# Patient Record
Sex: Male | Born: 2005 | Race: Black or African American | Hispanic: No | Marital: Single | Smoking: Never smoker
Health system: Southern US, Community
[De-identification: ages and names within clinical notes are randomized; demographics above are authoritative.]

## PROBLEM LIST (undated history)

## (undated) DIAGNOSIS — T148XXA Other injury of unspecified body region, initial encounter: Secondary | ICD-10-CM

## (undated) DIAGNOSIS — H539 Unspecified visual disturbance: Secondary | ICD-10-CM

## (undated) DIAGNOSIS — R010 Benign and innocent cardiac murmurs: Secondary | ICD-10-CM

---
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
## 2024-02-22 ENCOUNTER — Encounter: Payer: Self-pay | Admitting: Podiatry

## 2024-02-22 ENCOUNTER — Ambulatory Visit (INDEPENDENT_AMBULATORY_CARE_PROVIDER_SITE_OTHER): Admitting: Podiatry

## 2024-02-22 DIAGNOSIS — L0889 Other specified local infections of the skin and subcutaneous tissue: Secondary | ICD-10-CM

## 2024-02-22 DIAGNOSIS — L989 Disorder of the skin and subcutaneous tissue, unspecified: Secondary | ICD-10-CM

## 2024-02-22 MED ORDER — TRIAMCINOLONE ACETONIDE 0.5 % EX OINT: 1.0000 | TOPICAL_OINTMENT | Freq: Two times a day (BID) | CUTANEOUS | 1 refills | Status: DC

## 2024-02-22 NOTE — Progress Notes (Signed)
   HPI: 18 y.o. male presenting today for follow-up evaluation of a symptomatic skin lesion to the plantar aspect of the left heel.  Patient has had some mild improvement with the Lotrisone cream but he continues to have persistent hyperkeratosis of the skin.  Past Medical History:  Diagnosis Date   Fracture    wrist   Still's heart murmur    Vision abnormalities    wears glasses    Past Surgical History:  Procedure Laterality Date   ORIF WRIST FRACTURE Right 06/29/2020   Procedure: OPEN REDUCTION INTERNAL FIXATION (ORIF) WRIST FRACTURE;  Surgeon: Sheral Apley, MD;  Location: [facility];  Service: Orthopedics;  Laterality: Right;    No Known Allergies   LT foot 02/19/2022; resolved   LT foot 10/07/2023   LT foot 02/22/2024   Physical Exam: General: The patient is alert and oriented x3 in no acute distress.  Dermatology: Improved.  Hyperkeratotic skin lesion noted to the plantar aspect of the left forefoot.  No open wound noted.  Please see above noted photo  Vascular: Palpable pedal pulses bilaterally. Capillary refill within normal limits.  Negative for any significant edema or erythema  Neurological: Light touch and protective threshold grossly intact  Musculoskeletal Exam: No pedal deformities noted  Assessment: 1.  Hyperkeratotic skin lesion left plantar heel   Plan of Care:  -Patient evaluated.  - The patient has run out of the Lotrisone cream.  He was hoping to try different topical medication since he did not get the improvement that he was hoping for -Prescription for Kenalog ointment.  Apply twice daily under occlusion -Return to clinic as needed.  If the lesion persists we may need to proceed with skin biopsy.  *working two jobs at [employer] and [employer], DPM [facility]  Dr. Felecia Shelling, DPM    [address] [place], [place] [address]                Office [phone]  Fax [phone]

## 2024-05-05 ENCOUNTER — Other Ambulatory Visit: Payer: Self-pay

## 2024-05-05 ENCOUNTER — Emergency Department (HOSPITAL_COMMUNITY)

## 2024-05-05 ENCOUNTER — Encounter (HOSPITAL_COMMUNITY): Payer: Self-pay | Admitting: Emergency Medicine

## 2024-05-05 ENCOUNTER — Emergency Department (HOSPITAL_COMMUNITY)
Admission: EM | Admit: 2024-05-05 | Discharge: 2024-05-05 | Disposition: A | Discharge status: Home / Self Care | Attending: Emergency Medicine | Admitting: Emergency Medicine

## 2024-05-05 DIAGNOSIS — W230XXA Caught, crushed, jammed, or pinched between moving objects, initial encounter: Secondary | ICD-10-CM | POA: Diagnosis not present

## 2024-05-05 DIAGNOSIS — S60112A Contusion of left thumb with damage to nail, initial encounter: Secondary | ICD-10-CM | POA: Insufficient documentation

## 2024-05-05 MED ORDER — IBUPROFEN 400 MG PO TABS
400.0000 mg | ORAL_TABLET | Freq: Once | ORAL | Status: AC
  Administered 2024-05-05: 400 mg via ORAL
  Filled 2024-05-05: qty 1

## 2024-05-05 MED ORDER — HYDROCODONE-ACETAMINOPHEN 5-325 MG PO TABS
2.0000 | ORAL_TABLET | Freq: Once | ORAL | Status: AC
  Administered 2024-05-05: 2 via ORAL
  Filled 2024-05-05: qty 2

## 2024-05-05 NOTE — ED Provider Notes (Signed)
 [place] EMERGENCY DEPARTMENT AT [facility] Provider Note   CSN: [id number] Arrival date & time: 05/05/24  1654     Patient presents with: Finger Injury   Paul [name] is a 18 y.o. male.   Pt was at work today and left thumb accidentally closed in vehicle door. Pt with contusion to distal phalanx of thumb. Is right hand dominant. Skin intact. Denies other pain or injury.   The history is provided by the patient, a parent and medical records.       Prior to Admission medications   Medication Sig Start Date End Date Taking? Authorizing Provider  acetaminophen  (TYLENOL ) 160 MG/5ML liquid Take 17.2 mLs (550.4 mg total) by mouth every 6 (six) hours as needed for fever or pain. 10/22/17   Jannine Meo, NP  clotrimazole -betamethasone  (LOTRISONE ) cream Apply 1 Application topically 2 (two) times daily. 10/07/23   Dot Gazella, DPM  mupirocin ointment (BACTROBAN) 2 % SMARTSIG:sparingly Topical Twice Daily 02/04/22   [provider]  triamcinolone  ointment (KENALOG ) 0.5 % Apply 1 Application topically 2 (two) times daily. 02/22/24   Dot Gazella, DPM    Allergies: Patient has no known allergies.    Review of Systems  Musculoskeletal:        Contusion left thumb  Skin:  Negative for wound.  Neurological:  Negative for weakness and numbness.    Updated Vital Signs BP 138/87   Pulse 66   Temp 98.7 F (37.1 C)   Resp 18   SpO2 100%   Physical Exam Vitals and nursing note reviewed.  Constitutional:      Appearance: Normal appearance. He is well-developed.  HENT:     Head: Atraumatic.  Neck:     Trachea: No tracheal deviation.   Cardiovascular:     Pulses: Normal pulses.  Pulmonary:     Effort: Pulmonary effort is normal. No accessory muscle usage or respiratory distress.  Abdominal:     General: Bowel sounds are normal.   Musculoskeletal:     Comments: Mild swelling and tenderness to distal phalanx of left thumb. Normal cap refill  distally. Flexor/extensor tendon function intact. Subungual hematoma has been trephinated in triage (pt notes some improvement in discomfort). No laceration noted.    Skin:    General: Skin is warm and dry.     Findings: No rash.   Neurological:     Mental Status: He is alert.     Comments: Alert, speech clear. Left hand/thumb nvi.   Psychiatric:        Mood and Affect: Mood normal.     (all labs ordered are listed, but only abnormal results are displayed) Labs Reviewed - No data to display  EKG: None  Radiology: DG Finger Thumb Left Result Date: 05/05/2024 CLINICAL DATA:  Status post trauma. EXAM: LEFT THUMB 2+V COMPARISON:  None Available. FINDINGS: There is no evidence of fracture or dislocation. There is no evidence of arthropathy or other focal bone abnormality. Soft tissues are unremarkable. IMPRESSION: Negative. Electronically Signed   By: Virgle Grime M.D.   On: 05/05/2024 19:05     Procedures   Medications Ordered in the ED  ibuprofen  (ADVIL ) tablet 400 mg (has no administration in time range)  HYDROcodone -acetaminophen  (NORCO/VICODIN) 5-325 MG per tablet 2 tablet (2 tablets Oral Given 05/05/24 1914)  Medical Decision Making Problems Addressed: Contusion of left thumb with damage to nail, initial encounter: acute illness or injury Subungual hematoma of left thumb, initial encounter: acute illness or injury  Amount and/or Complexity of Data Reviewed Independent Historian: parent    Details: hx External Data Reviewed: notes. Radiology: ordered and independent interpretation performed. Decision-making details documented in ED Course.  Risk OTC drugs. Prescription drug management.   Xrays.   Nail trephinated by APP.   Hydrocodone  po, ibuprofen  po.   Xrays reviewed/interpreted by me - no fx.  Coldpack.   Pt appears stable for ed d/c.        Final diagnoses:  None    ED Discharge Orders     None           Guadalupe Lee, MD 05/05/24 2120

## 2024-05-05 NOTE — Discharge Instructions (Addendum)
 It was our pleasure to provide your ER care today - we hope that you feel better.  Elevate thumb to help with pain and swelling. Coldpack to sore area.   Take acetaminophen  or ibuprofen  as need for pain.  Return to ER if worse, new symptoms, intractable/severe pain, numbness/weakness, infection of thumb, or other concern.   You were given pain meds in the ER - no driving for the next 6 hours.

## 2024-05-05 NOTE — ED Triage Notes (Signed)
 Pt reports slamming his left thumb in a car door while at work today.

## 2024-05-05 NOTE — ED Provider Triage Note (Signed)
 Emergency Medicine Provider Triage Evaluation Note  Joby Richart , a 18 y.o. male  was evaluated in triage.  Pt complains of left thumb pain after having his thumb slammed in a car door.  He has pain in the entirety of the left thumb, no gross deformities.  No other associated injuries..  Review of Systems  Positive: Swelling and tenderness to the left thumb Negative: Deformity, bleeding  Physical Exam  BP (!) 142/102 (BP Location: Right Arm)   Pulse 62   Temp 99 F (37.2 C)   Resp 16   SpO2 100%  Gen:   Awake, no distress   Resp:  Normal effort  MSK:   Moves extremities without difficulty; swelling noted to the distal left thumb along with subungual hematoma. Other:    Medical Decision Making  Medically screening exam initiated at 6:55 PM.  Appropriate orders placed.  Richie Lainez was informed that the remainder of the evaluation will be completed by another provider, this initial triage assessment does not replace that evaluation, and the importance of remaining in the ED until their evaluation is complete.  To relieve initial pain and pressure associated with subungual hematoma, perform trephination of the left thumbnail.  Successfully evacuated blood from underneath of the nail which relieved some of the patient's discomfort.  For continued pain management will provide patient with additional dose of hydrocodone , pending x-ray results at this time.   Juanetta Nordmann, [place] 05/05/24 [phone]

## 2024-05-09 ENCOUNTER — Other Ambulatory Visit: Payer: Self-pay

## 2024-05-09 ENCOUNTER — Emergency Department (HOSPITAL_BASED_OUTPATIENT_CLINIC_OR_DEPARTMENT_OTHER)
Admission: EM | Admit: 2024-05-09 | Discharge: 2024-05-10 | Disposition: A | Discharge status: Home / Self Care | Attending: Emergency Medicine | Admitting: Emergency Medicine

## 2024-05-09 ENCOUNTER — Encounter (HOSPITAL_BASED_OUTPATIENT_CLINIC_OR_DEPARTMENT_OTHER): Payer: Self-pay

## 2024-05-09 DIAGNOSIS — Z23 Encounter for immunization: Secondary | ICD-10-CM | POA: Insufficient documentation

## 2024-05-09 DIAGNOSIS — W228XXD Striking against or struck by other objects, subsequent encounter: Secondary | ICD-10-CM | POA: Insufficient documentation

## 2024-05-09 DIAGNOSIS — S60112D Contusion of left thumb with damage to nail, subsequent encounter: Secondary | ICD-10-CM | POA: Insufficient documentation

## 2024-05-09 DIAGNOSIS — S6010XD Contusion of unspecified finger with damage to nail, subsequent encounter: Secondary | ICD-10-CM

## 2024-05-09 DIAGNOSIS — S6992XD Unspecified injury of left wrist, hand and finger(s), subsequent encounter: Secondary | ICD-10-CM | POA: Diagnosis present

## 2024-05-09 NOTE — ED Provider Notes (Signed)
  [place] EMERGENCY DEPARTMENT AT [facility] Provider Note   CSN: [id number] Arrival date & time: 05/09/24  2012     Patient presents with: Finger Injury   Paul [name] is a 18 y.o. male.  {Add pertinent medical, surgical, social history, OB history to YEP:67052} The history is provided by the patient.   Paul [name] is a 18 y.o. male who presents to the Emergency Department complaining of *** Thumb injury on Thursday. Had trephination performed, now there is more blood present and pain/pressure.    No fever,chills, redness.    Utd   Prior to Admission medications   Medication Sig Start Date End Date Taking? Authorizing Provider  acetaminophen  (TYLENOL ) 160 MG/5ML liquid Take 17.2 mLs (550.4 mg total) by mouth every 6 (six) hours as needed for fever or pain. 10/22/17   Everlean Laymon SAILOR, NP  clotrimazole -betamethasone  (LOTRISONE ) cream Apply 1 Application topically 2 (two) times daily. 10/07/23   Janit Thresa HERO, DPM  mupirocin ointment (BACTROBAN) 2 % SMARTSIG:sparingly Topical Twice Daily 02/04/22   [provider]  triamcinolone  ointment (KENALOG ) 0.5 % Apply 1 Application topically 2 (two) times daily. 02/22/24   Janit Thresa HERO, DPM    Allergies: Patient has no known allergies.    Review of Systems  Updated Vital Signs BP (!) 144/90 (BP Location: Right Arm)   Pulse 65   Temp 98.8 F (37.1 C)   Resp 16   Ht 5' 5 (1.651 m)   Wt 59 kg   SpO2 100%   BMI 21.63 kg/m   Physical Exam  (all labs ordered are listed, but only abnormal results are displayed) Labs Reviewed - No data to display  EKG: None  Radiology: No results found.  {Document cardiac monitor, telemetry assessment procedure when appropriate:32947} Procedures   Medications Ordered in the ED - No data to display    {Click here for ABCD2, HEART and other calculators REFRESH Note before signing:1}                              Medical Decision Making  ***  {Document  critical care time when appropriate  Document review of labs and clinical decision tools ie CHADS2VASC2, etc  Document your independent review of radiology images and any outside records  Document your discussion with family members, caretakers and with consultants  Document social determinants of health affecting pt's care  Document your decision making why or why not admission, treatments were needed:32947:::1}   Final diagnoses:  None    ED Discharge Orders     None

## 2024-05-09 NOTE — ED Triage Notes (Signed)
 Pt presents with a follow up from an injury on 6/19 where a car door was slammed into his L thumb. He was evaluated in the ED at [facility] and discharged home. Today pt is having increased pain and a blood collection under the nail.

## 2024-05-10 MED ORDER — TETANUS-DIPHTH-ACELL PERTUSSIS 5-2.5-18.5 LF-MCG/0.5 IM SUSY
0.5000 mL | PREFILLED_SYRINGE | Freq: Once | INTRAMUSCULAR | Status: AC
  Administered 2024-05-10: 0.5 mL via INTRAMUSCULAR
  Filled 2024-05-10: qty 0.5

## 2024-05-10 MED ORDER — ONDANSETRON 4 MG PO TBDP
4.0000 mg | ORAL_TABLET | Freq: Once | ORAL | Status: AC
  Administered 2024-05-10: 4 mg via ORAL
  Filled 2024-05-10: qty 1

## 2024-05-10 MED ORDER — LIDOCAINE HCL 2 % IJ SOLN
10.0000 mL | Freq: Once | INTRAMUSCULAR | Status: AC
  Administered 2024-05-10: 200 mg
  Filled 2024-05-10: qty 20

## 2024-05-10 MED ORDER — CEPHALEXIN 250 MG PO CAPS
500.0000 mg | ORAL_CAPSULE | Freq: Once | ORAL | Status: AC
  Administered 2024-05-10: 500 mg via ORAL
  Filled 2024-05-10: qty 2

## 2024-05-10 MED ORDER — CEPHALEXIN 500 MG PO CAPS: 500.0000 mg | ORAL_CAPSULE | Freq: Four times a day (QID) | ORAL | 0 refills | Status: DC

## 2024-05-10 NOTE — ED Notes (Signed)
 Pt has hematoma to entire tip of L thumb including nail bed and entire nail.

## 2024-06-06 ENCOUNTER — Ambulatory Visit (HOSPITAL_COMMUNITY)

## 2024-07-13 ENCOUNTER — Ambulatory Visit: Admitting: Podiatry

## 2024-08-01 ENCOUNTER — Ambulatory Visit (INDEPENDENT_AMBULATORY_CARE_PROVIDER_SITE_OTHER): Admitting: Podiatry

## 2024-08-01 DIAGNOSIS — D492 Neoplasm of unspecified behavior of bone, soft tissue, and skin: Secondary | ICD-10-CM | POA: Diagnosis not present

## 2024-08-01 MED ORDER — TRIAMCINOLONE ACETONIDE 0.5 % EX OINT: 1.0000 | TOPICAL_OINTMENT | Freq: Two times a day (BID) | CUTANEOUS | 3 refills | Status: AC

## 2024-08-01 NOTE — Progress Notes (Signed)
   Chief Complaint  Patient presents with   Skin Lesion    Bilateral medial heel/arch. Is not currently using topical meds due to running out.     HPI: 18 y.o. male presenting today for follow-up evaluation of a symptomatic skin lesion to the plantar aspect of the left heel.  Patient has had some mild improvement with the Kenalog  ointment.  He states that he ran out of the Kenalog  ointment and the callus skin returned  Past Medical History:  Diagnosis Date   Fracture    wrist   Still's heart murmur    Vision abnormalities    wears glasses    Past Surgical History:  Procedure Laterality Date   ORIF WRIST FRACTURE Right 06/29/2020   Procedure: OPEN REDUCTION INTERNAL FIXATION (ORIF) WRIST FRACTURE;  Surgeon: Beverley Evalene BIRCH, MD;  Location: [facility];  Service: Orthopedics;  Laterality: Right;    No Known Allergies   LT foot 02/19/2022; resolved   LT foot 10/07/2023   LT foot 02/22/2024   B/L feet 08/01/2024   Physical Exam: General: The patient is alert and oriented x3 in no acute distress.  Dermatology: Hyperkeratotic skin lesion noted to the plantar aspect of the bilateral heels symmetrical in appearance.  No open wound noted.  Please see above noted photo  Vascular: Palpable pedal pulses bilaterally. Capillary refill within normal limits.  Negative for any significant edema or erythema  Neurological: Light touch and protective threshold grossly intact  Musculoskeletal Exam: No pedal deformities noted  Assessment: 1.  Hyperkeratotic skin lesion bilateral feet   Plan of Care:  -Patient evaluated.  - Refill prescription for Kenalog  ointment.  Apply twice daily under occlusion -Continue good supportive tennis shoes and sneakers.  He does admit to walking around the house barefoot -Return to clinic PRN  *working currently at [employer] center  Thresa EMERSON Sar, DPM [facility]  Dr. Thresa EMERSON Sar, DPM    [address] [place], [place] [address]                Office [phone]  Fax [phone]

## 2024-08-31 ENCOUNTER — Ambulatory Visit (HOSPITAL_COMMUNITY)
Admission: EM | Admit: 2024-08-31 | Discharge: 2024-08-31 | Disposition: A | Discharge status: Home / Self Care | Attending: Family Medicine | Admitting: Family Medicine

## 2024-08-31 NOTE — ED Notes (Signed)
 No answer in lobby.

## 2024-08-31 NOTE — ED Notes (Signed)
Third attempt. No answer

## 2024-08-31 NOTE — ED Notes (Signed)
 Attempted to call patient in lobby. No response
